# Patient Record
Sex: Male | Born: 1990 | Race: Black or African American | Hispanic: No | Marital: Single | Smoking: Never smoker
Health system: Southern US, Community
[De-identification: ages and names within clinical notes are randomized; demographics above are authoritative.]

## PROBLEM LIST (undated history)

## (undated) DIAGNOSIS — D573 Sickle-cell trait: Secondary | ICD-10-CM

---

## 2013-08-09 ENCOUNTER — Emergency Department (HOSPITAL_COMMUNITY): Payer: BC Managed Care – PPO | Admitting: Certified Registered"

## 2013-08-09 ENCOUNTER — Inpatient Hospital Stay (HOSPITAL_COMMUNITY)
Admission: EM | Admit: 2013-08-09 | Discharge: 2013-08-11 | DRG: 982 | Disposition: A | Discharge status: Home / Self Care | Payer: BC Managed Care – PPO | Attending: General Surgery | Admitting: General Surgery

## 2013-08-09 ENCOUNTER — Encounter (HOSPITAL_COMMUNITY): Payer: BC Managed Care – PPO | Admitting: Certified Registered"

## 2013-08-09 ENCOUNTER — Encounter (HOSPITAL_COMMUNITY): Admission: EM | Disposition: A | Discharge status: Home / Self Care | Payer: Self-pay | Source: Home / Self Care

## 2013-08-09 ENCOUNTER — Emergency Department (HOSPITAL_COMMUNITY): Payer: BC Managed Care – PPO

## 2013-08-09 DIAGNOSIS — S36113A Laceration of liver, unspecified degree, initial encounter: Secondary | ICD-10-CM | POA: Diagnosis present

## 2013-08-09 DIAGNOSIS — S27809A Unspecified injury of diaphragm, initial encounter: Secondary | ICD-10-CM

## 2013-08-09 DIAGNOSIS — D62 Acute posthemorrhagic anemia: Secondary | ICD-10-CM | POA: Diagnosis not present

## 2013-08-09 DIAGNOSIS — S31109A Unspecified open wound of abdominal wall, unspecified quadrant without penetration into peritoneal cavity, initial encounter: Principal | ICD-10-CM | POA: Diagnosis present

## 2013-08-09 DIAGNOSIS — S21119A Laceration without foreign body of unspecified front wall of thorax without penetration into thoracic cavity, initial encounter: Secondary | ICD-10-CM

## 2013-08-09 DIAGNOSIS — I369 Nonrheumatic tricuspid valve disorder, unspecified: Secondary | ICD-10-CM

## 2013-08-09 DIAGNOSIS — S31119A Laceration without foreign body of abdominal wall, unspecified quadrant without penetration into peritoneal cavity, initial encounter: Secondary | ICD-10-CM | POA: Diagnosis present

## 2013-08-09 DIAGNOSIS — Y999 Unspecified external cause status: Secondary | ICD-10-CM

## 2013-08-09 DIAGNOSIS — Y9210 Unspecified residential institution as the place of occurrence of the external cause: Secondary | ICD-10-CM | POA: Diagnosis present

## 2013-08-09 DIAGNOSIS — S36119A Unspecified injury of liver, initial encounter: Secondary | ICD-10-CM

## 2013-08-09 DIAGNOSIS — S358X9A Unspecified injury of other blood vessels at abdomen, lower back and pelvis level, initial encounter: Secondary | ICD-10-CM

## 2013-08-09 HISTORY — PX: LAPAROTOMY: SHX154

## 2013-08-09 HISTORY — DX: Sickle-cell trait: D57.3

## 2013-08-09 LAB — CBC
HEMATOCRIT: 40.6 % (ref 39.0–52.0)
Hemoglobin: 14.6 g/dL (ref 13.0–17.0)
MCH: 29.1 pg (ref 26.0–34.0)
MCHC: 36 g/dL (ref 30.0–36.0)
MCV: 80.9 fL (ref 78.0–100.0)
Platelets: 292 10*3/uL (ref 150–400)
RBC: 5.02 MIL/uL (ref 4.22–5.81)
RDW: 13.8 % (ref 11.5–15.5)
WBC: 8.4 10*3/uL (ref 4.0–10.5)

## 2013-08-09 LAB — PREPARE PLATELET PHERESIS: UNIT DIVISION: 0

## 2013-08-09 LAB — POCT I-STAT 7, (LYTES, BLD GAS, ICA,H+H)
Acid-base deficit: 3 mmol/L — ABNORMAL HIGH (ref 0.0–2.0)
Acid-base deficit: 3 mmol/L — ABNORMAL HIGH (ref 0.0–2.0)
Bicarbonate: 23.4 mEq/L (ref 20.0–24.0)
Bicarbonate: 23.2 mEq/L (ref 20.0–24.0)
CALCIUM ION: 0.82 mmol/L — AB (ref 1.12–1.23)
Calcium, Ion: 0.99 mmol/L — ABNORMAL LOW (ref 1.12–1.23)
HCT: 32 % — ABNORMAL LOW (ref 39.0–52.0)
HCT: 33 % — ABNORMAL LOW (ref 39.0–52.0)
Hemoglobin: 10.9 g/dL — ABNORMAL LOW (ref 13.0–17.0)
Hemoglobin: 11.2 g/dL — ABNORMAL LOW (ref 13.0–17.0)
O2 SAT: 100 %
O2 SAT: 100 %
PO2 ART: 483 mmHg — AB (ref 80.0–100.0)
POTASSIUM: 3.4 meq/L — AB (ref 3.7–5.3)
Patient temperature: 36.3
Patient temperature: 35
Potassium: 4 mEq/L (ref 3.7–5.3)
Sodium: 143 mEq/L (ref 137–147)
Sodium: 141 mEq/L (ref 137–147)
TCO2: 25 mmol/L (ref 0–100)
TCO2: 24 mmol/L (ref 0–100)
pCO2 arterial: 43.1 mmHg (ref 35.0–45.0)
pCO2 arterial: 40.4 mmHg (ref 35.0–45.0)
pH, Arterial: 7.339 — ABNORMAL LOW (ref 7.350–7.450)
pH, Arterial: 7.357 (ref 7.350–7.450)
pO2, Arterial: 486 mmHg — ABNORMAL HIGH (ref 80.0–100.0)

## 2013-08-09 LAB — COMPREHENSIVE METABOLIC PANEL
ALBUMIN: 4.5 g/dL (ref 3.5–5.2)
ALK PHOS: 58 U/L (ref 39–117)
ALT: 19 U/L (ref 0–53)
AST: 35 U/L (ref 0–37)
BUN: 18 mg/dL (ref 6–23)
CO2: 19 mEq/L (ref 19–32)
Calcium: 9.3 mg/dL (ref 8.4–10.5)
Chloride: 102 mEq/L (ref 96–112)
Creatinine, Ser: 1.36 mg/dL — ABNORMAL HIGH (ref 0.50–1.35)
GFR calc Af Amer: 86 mL/min — ABNORMAL LOW (ref >90)
GFR calc non Af Amer: 74 mL/min — ABNORMAL LOW (ref >90)
Glucose, Bld: 133 mg/dL — ABNORMAL HIGH (ref 70–99)
POTASSIUM: 3.2 meq/L — AB (ref 3.7–5.3)
Sodium: 141 mEq/L (ref 137–147)
TOTAL PROTEIN: 7.5 g/dL (ref 6.0–8.3)
Total Bilirubin: 0.9 mg/dL (ref 0.3–1.2)

## 2013-08-09 LAB — MRSA PCR SCREENING: MRSA by PCR: NEGATIVE

## 2013-08-09 LAB — I-STAT CHEM 8, ED
BUN: 22 mg/dL (ref 6–23)
CHLORIDE: 101 meq/L (ref 96–112)
Calcium, Ion: 1.13 mmol/L (ref 1.12–1.23)
Creatinine, Ser: 1.5 mg/dL — ABNORMAL HIGH (ref 0.50–1.35)
Glucose, Bld: 130 mg/dL — ABNORMAL HIGH (ref 70–99)
HEMATOCRIT: 45 % (ref 39.0–52.0)
Hemoglobin: 15.3 g/dL (ref 13.0–17.0)
POTASSIUM: 3.2 meq/L — AB (ref 3.7–5.3)
SODIUM: 142 meq/L (ref 137–147)
TCO2: 25 mmol/L (ref 0–100)

## 2013-08-09 LAB — PREPARE RBC (CROSSMATCH)

## 2013-08-09 LAB — ETHANOL: Alcohol, Ethyl (B): <11 mg/dL (ref 0–11)

## 2013-08-09 LAB — I-STAT CG4 LACTIC ACID, ED: LACTIC ACID, VENOUS: 5.17 mmol/L — AB (ref 0.5–2.2)

## 2013-08-09 LAB — PROTIME-INR
INR: 1.27 (ref 0.00–1.49)
Prothrombin Time: 15.6 seconds — ABNORMAL HIGH (ref 11.6–15.2)

## 2013-08-09 LAB — ABO/RH: ABO/RH(D): O POS

## 2013-08-09 SURGERY — LAPAROTOMY, EXPLORATORY
Anesthesia: General | Site: Abdomen

## 2013-08-09 MED ORDER — PANTOPRAZOLE SODIUM 40 MG PO TBEC
40.0000 mg | DELAYED_RELEASE_TABLET | Freq: Every day | ORAL | Status: DC
  Administered 2013-08-10: 40 mg via ORAL
  Filled 2013-08-09: qty 1

## 2013-08-09 MED ORDER — ROCURONIUM BROMIDE 50 MG/5ML IV SOLN
INTRAVENOUS | Status: AC
  Filled 2013-08-09: qty 1

## 2013-08-09 MED ORDER — OXYCODONE HCL 5 MG/5ML PO SOLN: 5.0000 mg | Freq: Once | ORAL | Status: DC | PRN

## 2013-08-09 MED ORDER — ONDANSETRON HCL 4 MG PO TABS: 4.0000 mg | ORAL_TABLET | Freq: Four times a day (QID) | ORAL | Status: DC | PRN

## 2013-08-09 MED ORDER — PROMETHAZINE HCL 25 MG/ML IJ SOLN: 6.2500 mg | INTRAMUSCULAR | Status: DC | PRN

## 2013-08-09 MED ORDER — ONDANSETRON HCL 4 MG/2ML IJ SOLN: 4.0000 mg | Freq: Four times a day (QID) | INTRAMUSCULAR | Status: DC | PRN

## 2013-08-09 MED ORDER — MIDAZOLAM HCL 2 MG/2ML IJ SOLN
INTRAMUSCULAR | Status: DC | PRN
  Administered 2013-08-09: 1 mg via INTRAVENOUS

## 2013-08-09 MED ORDER — ALBUMIN HUMAN 5 % IV SOLN
INTRAVENOUS | Status: DC | PRN
  Administered 2013-08-09: 04:00:00 via INTRAVENOUS

## 2013-08-09 MED ORDER — FENTANYL CITRATE 0.05 MG/ML IJ SOLN
INTRAMUSCULAR | Status: AC
  Filled 2013-08-09: qty 5

## 2013-08-09 MED ORDER — MORPHINE SULFATE 4 MG/ML IJ SOLN
4.0000 mg | INTRAMUSCULAR | Status: DC | PRN
  Administered 2013-08-09 – 2013-08-10 (×8): 4 mg via INTRAVENOUS
  Filled 2013-08-09 (×8): qty 1

## 2013-08-09 MED ORDER — 0.9 % SODIUM CHLORIDE (POUR BTL) OPTIME
TOPICAL | Status: DC | PRN
  Administered 2013-08-09: 5000 mL

## 2013-08-09 MED ORDER — NEOSTIGMINE METHYLSULFATE 1 MG/ML IJ SOLN
INTRAMUSCULAR | Status: DC | PRN
  Administered 2013-08-09: 5 mg via INTRAVENOUS

## 2013-08-09 MED ORDER — ARTIFICIAL TEARS OP OINT
TOPICAL_OINTMENT | OPHTHALMIC | Status: AC
  Filled 2013-08-09: qty 3.5

## 2013-08-09 MED ORDER — CEFAZOLIN SODIUM-DEXTROSE 2-3 GM-% IV SOLR
INTRAVENOUS | Status: DC | PRN
  Administered 2013-08-09: 2 g via INTRAVENOUS

## 2013-08-09 MED ORDER — ETOMIDATE 2 MG/ML IV SOLN
INTRAVENOUS | Status: AC
  Filled 2013-08-09: qty 10

## 2013-08-09 MED ORDER — GLYCOPYRROLATE 0.2 MG/ML IJ SOLN
INTRAMUSCULAR | Status: DC | PRN
  Administered 2013-08-09: .8 mg via INTRAVENOUS

## 2013-08-09 MED ORDER — GLYCOPYRROLATE 0.2 MG/ML IJ SOLN
INTRAMUSCULAR | Status: AC
  Filled 2013-08-09: qty 4

## 2013-08-09 MED ORDER — NEOSTIGMINE METHYLSULFATE 1 MG/ML IJ SOLN
INTRAMUSCULAR | Status: AC
  Filled 2013-08-09: qty 10

## 2013-08-09 MED ORDER — HYDROMORPHONE HCL PF 1 MG/ML IJ SOLN
0.2500 mg | INTRAMUSCULAR | Status: DC | PRN
  Administered 2013-08-09 (×4): 0.5 mg via INTRAVENOUS

## 2013-08-09 MED ORDER — SODIUM CHLORIDE 0.9 % IV SOLN
INTRAVENOUS | Status: AC | PRN
  Administered 2013-08-09: 1000 mL via INTRAVENOUS

## 2013-08-09 MED ORDER — POVIDONE-IODINE 10 % EX OINT
TOPICAL_OINTMENT | CUTANEOUS | Status: AC
  Filled 2013-08-09: qty 28.35

## 2013-08-09 MED ORDER — FENTANYL CITRATE 0.05 MG/ML IJ SOLN
INTRAMUSCULAR | Status: DC | PRN
  Administered 2013-08-09: 100 ug via INTRAVENOUS

## 2013-08-09 MED ORDER — SUCCINYLCHOLINE CHLORIDE 20 MG/ML IJ SOLN
INTRAMUSCULAR | Status: AC
  Filled 2013-08-09: qty 1

## 2013-08-09 MED ORDER — MIDAZOLAM HCL 2 MG/2ML IJ SOLN: 0.5000 mg | Freq: Once | INTRAMUSCULAR | Status: DC | PRN

## 2013-08-09 MED ORDER — HYDROMORPHONE HCL PF 1 MG/ML IJ SOLN
INTRAMUSCULAR | Status: AC
  Filled 2013-08-09: qty 1

## 2013-08-09 MED ORDER — SUCCINYLCHOLINE CHLORIDE 20 MG/ML IJ SOLN
INTRAMUSCULAR | Status: DC | PRN
  Administered 2013-08-09: 180 mg via INTRAVENOUS

## 2013-08-09 MED ORDER — ROCURONIUM BROMIDE 100 MG/10ML IV SOLN
INTRAVENOUS | Status: DC | PRN
  Administered 2013-08-09: 50 mg via INTRAVENOUS

## 2013-08-09 MED ORDER — ENOXAPARIN SODIUM 40 MG/0.4ML ~~LOC~~ SOLN
40.0000 mg | SUBCUTANEOUS | Status: DC
  Administered 2013-08-09 – 2013-08-11 (×3): 40 mg via SUBCUTANEOUS
  Filled 2013-08-09 (×3): qty 0.4

## 2013-08-09 MED ORDER — MEPERIDINE HCL 25 MG/ML IJ SOLN
INTRAMUSCULAR | Status: AC
  Filled 2013-08-09: qty 1

## 2013-08-09 MED ORDER — MEPERIDINE HCL 25 MG/ML IJ SOLN
6.2500 mg | INTRAMUSCULAR | Status: DC | PRN
  Administered 2013-08-09: 12.5 mg via INTRAVENOUS

## 2013-08-09 MED ORDER — ARTIFICIAL TEARS OP OINT
TOPICAL_OINTMENT | OPHTHALMIC | Status: DC | PRN
  Administered 2013-08-09: 1 via OPHTHALMIC

## 2013-08-09 MED ORDER — SODIUM CHLORIDE 0.9 % IV SOLN
INTRAVENOUS | Status: DC | PRN
  Administered 2013-08-09 (×2): via INTRAVENOUS

## 2013-08-09 MED ORDER — ETOMIDATE 2 MG/ML IV SOLN
INTRAVENOUS | Status: DC | PRN
  Administered 2013-08-09: 20 mg via INTRAVENOUS

## 2013-08-09 MED ORDER — OXYCODONE HCL 5 MG PO TABS: 5.0000 mg | ORAL_TABLET | Freq: Once | ORAL | Status: DC | PRN

## 2013-08-09 MED ORDER — KCL IN DEXTROSE-NACL 20-5-0.9 MEQ/L-%-% IV SOLN
INTRAVENOUS | Status: DC
  Administered 2013-08-09: 100 mL/h via INTRAVENOUS
  Administered 2013-08-10: 04:00:00 via INTRAVENOUS
  Filled 2013-08-09 (×5): qty 1000

## 2013-08-09 MED ORDER — PANTOPRAZOLE SODIUM 40 MG IV SOLR
40.0000 mg | Freq: Every day | INTRAVENOUS | Status: DC
  Administered 2013-08-09: 40 mg via INTRAVENOUS
  Filled 2013-08-09 (×3): qty 40

## 2013-08-09 SURGICAL SUPPLY — 40 items
BLADE SURG ROTATE 9660 (MISCELLANEOUS) IMPLANT
CANISTER SUCTION 2500CC (MISCELLANEOUS) ×3 IMPLANT
CHLORAPREP W/TINT 26ML (MISCELLANEOUS) ×3 IMPLANT
COVER MAYO STAND STRL (DRAPES) IMPLANT
COVER SURGICAL LIGHT HANDLE (MISCELLANEOUS) ×3 IMPLANT
DRAPE LAPAROSCOPIC ABDOMINAL (DRAPES) ×3 IMPLANT
DRAPE PROXIMA HALF (DRAPES) IMPLANT
DRAPE WARM FLUID 44X44 (DRAPE) ×3 IMPLANT
ELECT BLADE 6.5 EXT (BLADE) ×3 IMPLANT
ELECT CAUTERY BLADE 6.4 (BLADE) ×6 IMPLANT
ELECT REM PT RETURN 9FT ADLT (ELECTROSURGICAL) ×3
ELECTRODE REM PT RTRN 9FT ADLT (ELECTROSURGICAL) ×1 IMPLANT
GLOVE BIO SURGEON STRL SZ7.5 (GLOVE) ×3 IMPLANT
GLOVE BIOGEL PI IND STRL 8 (GLOVE) ×1 IMPLANT
GLOVE BIOGEL PI INDICATOR 8 (GLOVE) ×2
GOWN STRL REUS W/ TWL LRG LVL3 (GOWN DISPOSABLE) ×3 IMPLANT
GOWN STRL REUS W/TWL LRG LVL3 (GOWN DISPOSABLE) ×6
HEMOSTAT SNOW SURGICEL 2X4 (HEMOSTASIS) ×3 IMPLANT
KIT BASIN OR (CUSTOM PROCEDURE TRAY) ×3 IMPLANT
KIT ROOM TURNOVER OR (KITS) ×3 IMPLANT
NS IRRIG 1000ML POUR BTL (IV SOLUTION) ×15 IMPLANT
PACK GENERAL/GYN (CUSTOM PROCEDURE TRAY) ×3 IMPLANT
PAD ARMBOARD 7.5X6 YLW CONV (MISCELLANEOUS) ×3 IMPLANT
SPONGE GAUZE 4X4 12PLY STER LF (GAUZE/BANDAGES/DRESSINGS) ×3 IMPLANT
SPONGE LAP 18X18 X RAY DECT (DISPOSABLE) ×12 IMPLANT
STAPLER VISISTAT 35W (STAPLE) ×3 IMPLANT
SUCTION POOLE TIP (SUCTIONS) ×3 IMPLANT
SUT PDS AB 1 TP1 96 (SUTURE) ×6 IMPLANT
SUT SILK 2 0 SH CR/8 (SUTURE) ×3 IMPLANT
SUT SILK 2 0 TIES 10X30 (SUTURE) ×3 IMPLANT
SUT SILK 3 0 SH CR/8 (SUTURE) IMPLANT
SUT SILK 3 0 TIES 10X30 (SUTURE) IMPLANT
SUT VIC AB 0 CT1 27 (SUTURE) ×2
SUT VIC AB 0 CT1 27XBRD ANBCTR (SUTURE) ×1 IMPLANT
SUT VIC AB 3-0 SH 18 (SUTURE) IMPLANT
TOWEL OR 17X26 10 PK STRL BLUE (TOWEL DISPOSABLE) ×3 IMPLANT
TRAY FOLEY CATH 16FRSI W/METER (SET/KITS/TRAYS/PACK) ×3 IMPLANT
TUBE CONNECTING 12'X1/4 (SUCTIONS)
TUBE CONNECTING 12X1/4 (SUCTIONS) IMPLANT
YANKAUER SUCT BULB TIP NO VENT (SUCTIONS) IMPLANT

## 2013-08-09 NOTE — ED Notes (Signed)
Stanley Ruiz: Cell Phone: [phone]

## 2013-08-09 NOTE — Progress Notes (Signed)
I have seen and examined the pt and agree with PA-Osborne's progress note.  
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
## 2013-08-09 NOTE — Progress Notes (Signed)
Removed patients NGT and foley per MD order. Patient tolerated well. Will continue to monitor.

## 2013-08-09 NOTE — Anesthesia Postprocedure Evaluation (Signed)
  Anesthesia Post-op Note  Patient: Stanley NossBruce XXXBeckford  Procedure(s) Performed: Procedure(s): EXPLORATORY LAPAROTOMY LIGATION OF EPIGASTRIC VESSELS (N/A)  Patient Location: PACU  Anesthesia Type:General  Level of Consciousness: awake, alert , oriented and patient cooperative  Airway and Oxygen Therapy: Patient Spontanous Breathing and Patient connected to nasal cannula oxygen  Post-op Pain: mild  Post-op Assessment: Post-op Vital signs reviewed, Patient's Cardiovascular Status Stable, Respiratory Function Stable, Patent Airway, No signs of Nausea or vomiting and Pain level controlled  Post-op Vital Signs: Reviewed and stable  Last Vitals:  Filed Vitals:   08/09/13 0615  BP: 155/79  Pulse: 65  Temp:   Resp: 14    Complications: No apparent anesthesia complications

## 2013-08-09 NOTE — ED Notes (Signed)
Patient to OR
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
## 2013-08-09 NOTE — Addendum Note (Signed)
Addendum created 08/09/13 0659 by Germaine PomfretE. Corneshia Hines, MD   Modules edited: Anesthesia Blocks and Procedures, Clinical Notes   Clinical Notes:  File: [id number]

## 2013-08-09 NOTE — Progress Notes (Signed)
Pt transferred from PACU with RN on monitor. Pt oriented x4 and arousable. Pt VSS, MD Carolynne Edouardoth made aware of aline BP in 200's in pacu and about pink tinged urine by pacu RN. Pt stated that [place]. Pt NGT in place, draining red, bloody drainage, lower lip clean dry and intact with sutures in place, abd dressing shadowing to left upper corner and marked. Will continue to monitor.

## 2013-08-09 NOTE — Transfer of Care (Signed)
Immediate Anesthesia Transfer of Care Note  Patient: Stanley Ruiz  Procedure(s) Performed: Procedure(s): EXPLORATORY LAPAROTOMY (N/A)  Patient Location: PACU  Anesthesia Type:General  Level of Consciousness: sedated  Airway & Oxygen Therapy: Patient Spontanous Breathing and Patient connected to nasal cannula oxygen  Post-op Assessment: Report given to PACU RNCathie Hoops- Yu Rn  Post vital signs: Reviewed and stable  Complications: No apparent anesthesia complications

## 2013-08-09 NOTE — ED Notes (Signed)
Dr. Carolynne Edouardoth at bedside speaking with pt. Pt gives verbal consent to receiving 1 unit of blood.  Janelle, rn witness.
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
## 2013-08-09 NOTE — Progress Notes (Signed)
  Echocardiogram 2D Echocardiogram has been performed.  Stanley MessickLauren A Ruiz 08/09/2013, 12:22 PM
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
## 2013-08-09 NOTE — Progress Notes (Signed)
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  Chaplain provided beverages from nourishment center.  Chaplain gave a supportive presence.n  All the young men were greatly appreciative.     08/09/13 0700  Clinical Encounter Type  Visited With Family  Visit Type Spiritual support;Patient in surgery   Rulon Abideavid B Sherrod, chaplain [phone]
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
## 2013-08-10 ENCOUNTER — Inpatient Hospital Stay (HOSPITAL_COMMUNITY): Payer: BC Managed Care – PPO

## 2013-08-10 ENCOUNTER — Encounter (HOSPITAL_COMMUNITY): Payer: Self-pay | Admitting: General Surgery

## 2013-08-10 DIAGNOSIS — D62 Acute posthemorrhagic anemia: Secondary | ICD-10-CM | POA: Diagnosis not present

## 2013-08-10 DIAGNOSIS — S31119A Laceration without foreign body of abdominal wall, unspecified quadrant without penetration into peritoneal cavity, initial encounter: Secondary | ICD-10-CM | POA: Diagnosis present

## 2013-08-10 LAB — PREPARE FRESH FROZEN PLASMA
Unit division: 0
Unit division: 0
Unit division: 0
Unit division: 0
Unit division: 0
Unit division: 0
Unit division: 0
Unit division: 0
Unit division: 0
Unit division: 0

## 2013-08-10 LAB — CBC
HEMATOCRIT: 36.5 % — AB (ref 39.0–52.0)
Hemoglobin: 13.2 g/dL (ref 13.0–17.0)
MCH: 29.2 pg (ref 26.0–34.0)
MCHC: 36.2 g/dL — ABNORMAL HIGH (ref 30.0–36.0)
MCV: 80.8 fL (ref 78.0–100.0)
Platelets: 163 10*3/uL (ref 150–400)
RBC: 4.52 MIL/uL (ref 4.22–5.81)
RDW: 14.2 % (ref 11.5–15.5)
WBC: 9 10*3/uL (ref 4.0–10.5)

## 2013-08-10 LAB — BASIC METABOLIC PANEL
BUN: 8 mg/dL (ref 6–23)
CALCIUM: 8.5 mg/dL (ref 8.4–10.5)
CO2: 25 mEq/L (ref 19–32)
CREATININE: 1.1 mg/dL (ref 0.50–1.35)
Chloride: 104 mEq/L (ref 96–112)
Glucose, Bld: 110 mg/dL — ABNORMAL HIGH (ref 70–99)
Potassium: 3.9 mEq/L (ref 3.7–5.3)
Sodium: 140 mEq/L (ref 137–147)

## 2013-08-10 MED ORDER — DOCUSATE SODIUM 100 MG PO CAPS
100.0000 mg | ORAL_CAPSULE | Freq: Two times a day (BID) | ORAL | Status: DC
  Administered 2013-08-10 – 2013-08-11 (×3): 100 mg via ORAL
  Filled 2013-08-10 (×3): qty 1

## 2013-08-10 MED ORDER — POLYETHYLENE GLYCOL 3350 17 G PO PACK
17.0000 g | PACK | Freq: Every day | ORAL | Status: DC
  Administered 2013-08-10 – 2013-08-11 (×2): 17 g via ORAL
  Filled 2013-08-10 (×2): qty 1

## 2013-08-10 MED ORDER — MORPHINE SULFATE 2 MG/ML IJ SOLN
2.0000 mg | INTRAMUSCULAR | Status: DC | PRN
  Administered 2013-08-10 (×2): 2 mg via INTRAVENOUS
  Filled 2013-08-10 (×2): qty 1

## 2013-08-10 MED ORDER — OXYCODONE HCL 5 MG PO TABS
5.0000 mg | ORAL_TABLET | ORAL | Status: DC | PRN
Start: 2013-08-10 — End: 2013-08-11
  Administered 2013-08-10: 10 mg via ORAL
  Administered 2013-08-10: 5 mg via ORAL
  Filled 2013-08-10: qty 2
  Filled 2013-08-10: qty 1

## 2013-08-10 MED ORDER — BACITRACIN ZINC 500 UNIT/GM EX OINT
TOPICAL_OINTMENT | Freq: Two times a day (BID) | CUTANEOUS | Status: DC
  Administered 2013-08-10: 15.5556 via TOPICAL
  Administered 2013-08-10: 11:00:00 via TOPICAL
  Administered 2013-08-11: 15.5556 via TOPICAL
  Filled 2013-08-10: qty 15
  Filled 2013-08-10: qty 28.35
  Filled 2013-08-10: qty 15

## 2013-08-10 NOTE — Progress Notes (Signed)
The patient is doing very well.  Hemoglobin is stable.  Okay to transfer out of the ICU.  This patient has been seen and I agree with the findings and treatment plan.  Marta LamasJames O. Gae BonWyatt, III, MD, FACS [phone] (pager) [phone] (direct pager) Trauma Surgeon
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
## 2013-08-10 NOTE — Progress Notes (Signed)
[place] called to [place], Charity fundraiserN.  All questions answered, patient and mother aware of transfer.  No complaints.

## 2013-08-11 MED ORDER — NAPROXEN 500 MG PO TABS: 500.0000 mg | ORAL_TABLET | Freq: Two times a day (BID) | ORAL | Status: AC

## 2013-08-11 MED ORDER — OXYCODONE-ACETAMINOPHEN 5-325 MG PO TABS: 1.0000 | ORAL_TABLET | ORAL | Status: AC | PRN

## 2013-08-11 MED ORDER — NAPROXEN 500 MG PO TABS
500.0000 mg | ORAL_TABLET | Freq: Two times a day (BID) | ORAL | Status: DC
  Administered 2013-08-11: 500 mg via ORAL
  Filled 2013-08-11 (×4): qty 1
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
## 2013-08-11 NOTE — Progress Notes (Signed)
Discharge instructions given and explained to pt. 2  prescriptions given to pt. Along with match letter.  Pt. Verbalized understanding of all orders/instructions.  IV's removed.  Pt. In stable condition for discharge and awaiting ride from mother. Vanice Sarahaylor L Thompson

## 2013-08-11 NOTE — Discharge Instructions (Signed)
Wash wounds daily in shower with soap and water. Do not soak. Apply antibiotic ointment (e.g. Neosporin) twice daily and as needed to keep moist. Cover with dry dressing if desired.  No running, jumping, ball or contact sports, bikes, skateboards, or lifting more than 5 pounds for 6 weeks.
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
## 2013-08-11 NOTE — Progress Notes (Signed)
Hope to D/C this PM. [place]. Patient examined and I agree with the assessment and plan  Violeta GelinasBurke Krikor Willet, MD, MPH, FACS Trauma: [phone] General Surgery: [phone]  08/11/2013 11:22 AM

## 2013-08-11 NOTE — Clinical Social Work Note (Signed)
Clinical Social Worker continuing to follow patient and family for support and discharge planning needs.  CSW met with patient, patient mother, and patient girlfriend at bedside who states that patient will be returning home to [place] with his mother at discharge.  Patient mother in communication with the [employer] regarding patient return to school.  Patient mother states that discharge instruction paperwork will be sufficient for school documentation.    Clinical Social Worker inquired about current substance use and provided information regarding risks of continued use.  Patient states that there are no current concerns with drugs and/or alcohol and will not participate even socially with friends while on medication prescribed at discharge.  SBIRT complete.  No resources needed at this time.  Patient with safe discharge plans - CSW signing off.  Please reconsult if further needs arise prior to discharge.  Barbette Or, [place]

## 2013-08-11 NOTE — Discharge Summary (Signed)
Laurisa Sahakian, MD, MPH, FACS Trauma: [phone] General Surgery: [phone]  

## 2013-08-12 LAB — TYPE AND SCREEN
ABO/RH(D): O POS
Antibody Screen: NEGATIVE
UNIT DIVISION: 0
UNIT DIVISION: 0
UNIT DIVISION: 0
UNIT DIVISION: 0
UNIT DIVISION: 0
Unit division: 0
Unit division: 0
Unit division: 0
Unit division: 0
Unit division: 0

## 2013-08-19 ENCOUNTER — Telehealth (INDEPENDENT_AMBULATORY_CARE_PROVIDER_SITE_OTHER): Payer: Self-pay

## 2013-08-19 ENCOUNTER — Ambulatory Visit (INDEPENDENT_AMBULATORY_CARE_PROVIDER_SITE_OTHER): Payer: BC Managed Care – PPO | Admitting: Orthopedic Surgery

## 2013-08-19 ENCOUNTER — Encounter (INDEPENDENT_AMBULATORY_CARE_PROVIDER_SITE_OTHER): Payer: Self-pay

## 2013-08-19 VITALS — BP 130/80 | HR 82 | Temp 97.5°F | Resp 12 | Ht 78.0 in | Wt 217.0 lb

## 2013-08-19 DIAGNOSIS — S31109A Unspecified open wound of abdominal wall, unspecified quadrant without penetration into peritoneal cavity, initial encounter: Secondary | ICD-10-CM

## 2013-08-19 DIAGNOSIS — S31119A Laceration without foreign body of abdominal wall, unspecified quadrant without penetration into peritoneal cavity, initial encounter: Secondary | ICD-10-CM
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
